# Patient Record
Sex: Female | Born: 1960 | Race: White | Hispanic: No | Marital: Married | Smoking: Never smoker
Health system: Southern US, Community
[De-identification: ages and names within clinical notes are randomized; demographics above are authoritative.]

## PROBLEM LIST (undated history)

## (undated) DIAGNOSIS — F419 Anxiety disorder, unspecified: Secondary | ICD-10-CM

## (undated) DIAGNOSIS — T7840XA Allergy, unspecified, initial encounter: Secondary | ICD-10-CM

## (undated) DIAGNOSIS — E559 Vitamin D deficiency, unspecified: Secondary | ICD-10-CM

## (undated) DIAGNOSIS — E7800 Pure hypercholesterolemia, unspecified: Secondary | ICD-10-CM

## (undated) DIAGNOSIS — D751 Secondary polycythemia: Secondary | ICD-10-CM

## (undated) DIAGNOSIS — Q8500 Neurofibromatosis, unspecified: Secondary | ICD-10-CM

## (undated) DIAGNOSIS — B019 Varicella without complication: Secondary | ICD-10-CM

## (undated) DIAGNOSIS — K219 Gastro-esophageal reflux disease without esophagitis: Secondary | ICD-10-CM

## (undated) DIAGNOSIS — N6019 Diffuse cystic mastopathy of unspecified breast: Secondary | ICD-10-CM

## (undated) HISTORY — DX: Varicella without complication: B01.9

## (undated) HISTORY — DX: Gastro-esophageal reflux disease without esophagitis: K21.9

## (undated) HISTORY — DX: Vitamin D deficiency, unspecified: E55.9

## (undated) HISTORY — DX: Pure hypercholesterolemia, unspecified: E78.00

## (undated) HISTORY — DX: Secondary polycythemia: D75.1

## (undated) HISTORY — DX: Anxiety disorder, unspecified: F41.9

## (undated) HISTORY — DX: Diffuse cystic mastopathy of unspecified breast: N60.19

## (undated) HISTORY — DX: Neurofibromatosis, unspecified: Q85.00

## (undated) HISTORY — DX: Allergy, unspecified, initial encounter: T78.40XA

---
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
## 1987-06-28 HISTORY — PX: OTHER SURGICAL HISTORY: SHX169

## 2008-03-06 ENCOUNTER — Ambulatory Visit: Payer: Self-pay

## 2008-03-20 ENCOUNTER — Ambulatory Visit: Payer: Self-pay

## 2009-03-30 ENCOUNTER — Ambulatory Visit: Payer: Self-pay

## 2009-04-09 ENCOUNTER — Ambulatory Visit: Payer: Self-pay

## 2011-08-30 ENCOUNTER — Ambulatory Visit: Payer: Self-pay

## 2013-11-29 LAB — HM PAP SMEAR: HM Pap smear: NORMAL

## 2014-09-19 LAB — CBC AND DIFFERENTIAL: WBC: 9 10^3/mL

## 2014-09-25 LAB — BASIC METABOLIC PANEL WITH GFR
BUN: 16 mg/dL (ref 4–21)
Creatinine: 0.8 mg/dL (ref 0.5–1.1)
Glucose: 80 mg/dL
Potassium: 4.2 mmol/L (ref 3.4–5.3)
Sodium: 138 mmol/L (ref 137–147)

## 2014-09-25 LAB — HEPATIC FUNCTION PANEL
ALT: 7 U/L (ref 7–35)
AST: 15 U/L (ref 13–35)
Alkaline Phosphatase: 80 U/L (ref 25–125)
Bilirubin, Total: 0.5 mg/dL

## 2014-09-25 LAB — LIPID PANEL
Cholesterol: 265 mg/dL — AB (ref 0–200)
HDL: 101 mg/dL — AB (ref 35–70)
LDL Cholesterol: 135 mg/dL
Triglycerides: 144 mg/dL (ref 40–160)

## 2014-09-25 LAB — TSH: TSH: 3.08 u[IU]/mL (ref 0.41–5.90)

## 2014-10-10 ENCOUNTER — Ambulatory Visit
Admit: 2014-10-10 | Disposition: A | Discharge status: Home / Self Care | Payer: Self-pay | Attending: Hematology and Oncology | Admitting: Hematology and Oncology

## 2014-10-10 DIAGNOSIS — D751 Secondary polycythemia: Secondary | ICD-10-CM | POA: Insufficient documentation

## 2014-10-10 LAB — COMPREHENSIVE METABOLIC PANEL
Albumin: 4.7 g/dL
Alkaline Phosphatase: 80 U/L
Anion Gap: 6 — ABNORMAL LOW (ref 7–16)
BUN: 18 mg/dL
Bilirubin,Total: 0.6 mg/dL
Calcium, Total: 10.3 mg/dL
Chloride: 102 mmol/L
Co2: 30 mmol/L
Creatinine: 0.73 mg/dL
EGFR (African American): >60
EGFR (Non-African Amer.): >60
Glucose: 95 mg/dL
Potassium: 4.3 mmol/L
SGOT(AST): 18 U/L
SGPT (ALT): 10 U/L — ABNORMAL LOW
Sodium: 138 mmol/L
Total Protein: 7.8 g/dL

## 2014-10-10 LAB — URINALYSIS, COMPLETE
Bacteria: NONE SEEN
Bilirubin,UR: NEGATIVE
Glucose,UR: NEGATIVE mg/dL (ref 0–75)
Ketone: NEGATIVE
Leukocyte Esterase: NEGATIVE
Nitrite: NEGATIVE
Ph: 7 (ref 4.5–8.0)
Protein: NEGATIVE
Specific Gravity: 1.005 (ref 1.003–1.030)

## 2014-10-10 LAB — CBC CANCER CENTER
Basophil #: 0 x10 3/mm (ref 0.0–0.1)
Basophil %: 0.5 %
Eosinophil #: 0.3 x10 3/mm (ref 0.0–0.7)
Eosinophil %: 2.8 %
HCT: 49.5 % — ABNORMAL HIGH (ref 35.0–47.0)
HGB: 16.5 g/dL — ABNORMAL HIGH (ref 12.0–16.0)
Lymphocyte #: 2.3 x10 3/mm (ref 1.0–3.6)
Lymphocyte %: 25.2 %
MCH: 31.3 pg (ref 26.0–34.0)
MCHC: 33.3 g/dL (ref 32.0–36.0)
MCV: 94 fL (ref 80–100)
Monocyte #: 0.8 x10 3/mm (ref 0.2–0.9)
Monocyte %: 9.5 %
Neutrophil #: 5.6 x10 3/mm (ref 1.4–6.5)
Neutrophil %: 62 %
Platelet: 208 x10 3/mm (ref 150–440)
RBC: 5.26 10*6/uL — ABNORMAL HIGH (ref 3.80–5.20)
RDW: 13.3 % (ref 11.5–14.5)
WBC: 9 x10 3/mm (ref 3.6–11.0)

## 2014-10-10 LAB — IRON AND TIBC
Iron Bind.Cap.(Total): 349 (ref 250–450)
Iron Saturation: 27.5
Iron: 96 ug/dL
Unbound Iron-Bind.Cap.: 252.7

## 2014-10-10 LAB — FERRITIN: Ferritin (ARMC): 66 ng/mL

## 2014-10-21 ENCOUNTER — Ambulatory Visit: Payer: Self-pay | Admitting: Nurse Practitioner

## 2014-10-21 ENCOUNTER — Telehealth: Payer: Self-pay

## 2014-10-21 NOTE — Telephone Encounter (Signed)
Please advise 

## 2014-10-21 NOTE — Telephone Encounter (Signed)
The patient called and is hoping to become a new patient of Dr.Tullo's.  She was informed Dr.Tullo is not taking on new patients at this time, but insisted a note be sent asking.

## 2014-10-21 NOTE — Telephone Encounter (Signed)
Since she did not offer a family member as an established patient i cannot extend my services to her unless I have done this personally, and I do not remember her or recognize her name.

## 2014-11-03 ENCOUNTER — Telehealth: Payer: Self-pay | Admitting: *Deleted

## 2014-11-03 LAB — HM MAMMOGRAPHY: HM MAMMO: NORMAL

## 2014-11-03 NOTE — Telephone Encounter (Signed)
Asking if the neurology referral has been made yet, she has a couple names. Dr Rudell Cobb or Dr Crisoforo Oxford at [facility] are both Neurologists

## 2014-11-04 ENCOUNTER — Encounter: Payer: Self-pay | Admitting: Hematology and Oncology

## 2014-11-04 ENCOUNTER — Telehealth: Payer: Self-pay | Admitting: *Deleted

## 2014-11-04 ENCOUNTER — Other Ambulatory Visit: Payer: Self-pay | Admitting: Hematology and Oncology

## 2014-11-04 DIAGNOSIS — Q8500 Neurofibromatosis, unspecified: Secondary | ICD-10-CM

## 2014-11-04 DIAGNOSIS — D751 Secondary polycythemia: Secondary | ICD-10-CM | POA: Insufficient documentation

## 2014-11-04 NOTE — Telephone Encounter (Signed)
No fu scheduled until June and is asking if we can call her with results of tests she has had done, sh eis also asking about the neurology referral appt

## 2014-11-05 ENCOUNTER — Telehealth: Payer: Self-pay | Admitting: *Deleted

## 2014-11-05 NOTE — Telephone Encounter (Signed)
B,  Sheryl Proctor was working on this yesterday.  I think she is going to talk with Juliann Pulse.  She was going to have a mammogram before follow-up.  M

## 2014-11-05 NOTE — Telephone Encounter (Signed)
pt has mammogram on 5/24 at [facility]. She has a f/u apt with Dr. Zenia Resides on 11/28/14. Pt wants to keep this appointment. Results reviewed with patient. She was reassured of findings.  Will discuss further with md at apt for f/u recommendations.  Reassured pt that neurology referral was entered.  Pt states that she saw Dr. Melrose Nakayama at [facility] neurology in April, but declines further visits with this provider. This is why she was requesting a new referral to a different neurologist.  Reassured patient that this order was entered yesterday. Scheduling will arrange.

## 2014-11-26 ENCOUNTER — Ambulatory Visit (INDEPENDENT_AMBULATORY_CARE_PROVIDER_SITE_OTHER): Payer: BLUE CROSS/BLUE SHIELD | Admitting: Internal Medicine

## 2014-11-26 ENCOUNTER — Encounter: Payer: Self-pay | Admitting: Internal Medicine

## 2014-11-26 ENCOUNTER — Encounter (INDEPENDENT_AMBULATORY_CARE_PROVIDER_SITE_OTHER): Payer: Self-pay

## 2014-11-26 VITALS — BP 118/80 | HR 74 | Temp 97.6°F | Resp 16 | Ht 60.0 in | Wt 111.2 lb

## 2014-11-26 DIAGNOSIS — F411 Generalized anxiety disorder: Secondary | ICD-10-CM | POA: Diagnosis not present

## 2014-11-26 DIAGNOSIS — R911 Solitary pulmonary nodule: Secondary | ICD-10-CM

## 2014-11-26 DIAGNOSIS — Z1211 Encounter for screening for malignant neoplasm of colon: Secondary | ICD-10-CM

## 2014-11-26 DIAGNOSIS — Z8669 Personal history of other diseases of the nervous system and sense organs: Secondary | ICD-10-CM | POA: Diagnosis not present

## 2014-11-26 MED ORDER — DIAZEPAM 5 MG PO TABS: 5.0000 mg | ORAL_TABLET | Freq: Two times a day (BID) | ORAL | Status: DC | PRN

## 2014-11-26 NOTE — Patient Instructions (Addendum)
Trial  Of low dose valium for your anxiety  You can cut it in half initially until you know how sedating it is  DO NOT SHARE WITH OTHERS OR COMBINE WITH ALCOHOL

## 2014-11-26 NOTE — Progress Notes (Signed)
Subjective:  Patient ID: Sheryl Proctor, female    DOB: 1960-07-24  Age: 54 y.o. MRN: [id number]  CC: The primary encounter diagnosis was Colon cancer screening. Diagnoses of H/O absence seizures, Anxiety state, and Solitary pulmonary nodule were also pertinent to this visit.  HPI Sheryl Proctor presents for  Establishment of care .    Has had 3 episodes of behavior described "spacing out'  During which she was observed by husband to have her eyes open but was unresponsive and made repetitive movements witht her lips.  The first known episode occurred in April .  She has also observed at work to have at least 2 similar episodes.   She has stopped working May 9 from [employer].  Stopped driving .     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  Family histor : father and uncles.  Had an MRI done last month which suggested recent seizure activity by presence of a T2 hyperintense left hippocampal complex . Referral was made to Dr Sheryl Proctor for evaluation with EEG.  Her initial OV with Neurology was done on April 18, and the EEG was done May 11 but she has not received any results as of June 1.  She was not impressed by her encounter and does not want to see him again.   He is requesting to be referred to one of 2 neurologists at [facility]  Instead:   Sheryl Proctor  And Dr. Vernard Proctor   History of polycythemia :  Newly diagnosed, Sees Sheryl Proctor for polycythemia ,workup still in progress   Chest mass:   R lung base April chest x ray.  Had CT of chest on 4/28, no results  Given to patient  bc she is seeing dr Sheryl Proctor on Friday   20 lb wt loss in the past year   [place] [facility] last PAP 2015 normal  Mammogram normal Nov 18 2014 History Sheryl Proctor has a past medical history of Neurofibromatosis; Polycythemia; Vitamin D deficiency; Allergy; Hypercholesteremia; GERD (gastroesophageal reflux disease); Fibrocystic breast; Anxiety; Neurofibromatosis; and Chicken pox.   She has past  surgical history that includes harrington rods (1975) and cryotherapy (1989).   Her family history includes Hypercholesterolemia in her mother; Hypertension in her mother; Lung cancer in her father; Thyroid disease in her sister; Uterine cancer in her maternal grandmother.She reports that she has never smoked. She has never used smokeless tobacco. She reports that she does not drink alcohol or use illicit drugs.  No outpatient prescriptions prior to visit.   No facility-administered medications prior to visit.    Review of Systems:  Patient denies headache, fevers, malaise, unintentional weight loss, skin rash, eye pain, sinus congestion and sinus pain, sore throat, dysphagia,  hemoptysis , cough, dyspnea, wheezing, chest pain, palpitations, orthopnea, edema, abdominal pain, nausea, melena, diarrhea, constipation, flank pain, dysuria, hematuria, urinary  Frequency, nocturia, numbness, tingling, seizures,  Focal weakness, Loss of consciousness,  Tremor, insomnia, depression, anxiety, and suicidal ideation.     Objective:  BP 118/80 mmHg  Pulse 74  Temp(Src) 97.6 F (36.4 C) (Oral)  Resp 16  Ht 5' (1.524 m)  Wt 111 lb 4 oz (50.463 kg)  BMI 21.73 kg/m2  SpO2 100%  Physical Exam:  General appearance: alert, cooperative and appears stated age Head: Normocephalic, without obvious abnormality, atraumatic Eyes: conjunctivae/corneas clear. PERRL, EOM's intact. Fundi benign. Ears: normal TM's and external ear canals both ears Nose: Nares normal. Septum midline. Mucosa normal.  No drainage or sinus tenderness. Throat: lips, mucosa, and tongue normal; teeth and gums normal Neck: no adenopathy, no carotid bruit, no JVD, supple, symmetrical, trachea midline and thyroid not enlarged, symmetric, no tenderness/mass/nodules Lungs: clear to auscultation bilaterally Breasts: normal appearance, no masses or tenderness Heart: regular rate and rhythm, S1, S2 normal, no murmur, click, rub or  gallop Abdomen: soft, non-tender; bowel sounds normal; no masses,  no organomegaly Extremities: extremities normal, atraumatic, no cyanosis or edema Pulses: 2+ and symmetric Skin: Skin color, texture, turgor normal. No rashes.  mulitiple discrete jodules Neurologic: Alert and oriented X 3, normal strength and tone. Normal symmetric reflexes. Normal coordination and gait.   Assessment & Plan:   Problem List Items Addressed This Visit    H/O absence seizures    Suggested by history,  MRI done showing hippocampal hyperintensity.  Agree with patient's decision not to drive until she has been treated.  She has had an EEG but Dr Sheryl Proctor has not notified her of the results and she is requesting a change in neurologist,  Referral to [facility] Neurology requested and in process.       Relevant Orders   Ambulatory referral to Neurology   Anxiety state    Low dose valium to use prn . The risks and benefits of benzodiazepine use were discussed with patient today including excessive sedation leading to respiratory depression,  impaired thinking/driving, and addiction.  Patient was advised to avoid concurrent use with alcohol, to use medication only as needed and not to share with others  .       Relevant Medications   diazepam (VALIUM) 5 MG tablet   Solitary pulmonary nodule    Right lung base, by chest x ray April 2016 workup in progress with T scan ordered by Dr Sheryl Proctor.         Other Visit Diagnoses    Colon cancer screening    -  Primary    Relevant Orders    Ambulatory referral to General Surgery       I am having Ms. Sheryl Proctor start on diazepam. I am also having her maintain her Vitamin D3 and vitamin E.  Meds ordered this encounter  Medications  . Cholecalciferol (VITAMIN D3) 2000 UNITS TABS    Sig: Take 1 tablet by mouth daily.  . vitamin E 400 UNIT capsule    Sig: Take 400 Units by mouth daily.  . diazepam (VALIUM) 5 MG tablet    Sig: Take 1 tablet (5 mg total) by mouth every 12  (twelve) hours as needed for anxiety.    Dispense:  30 tablet    Refill:  1    There are no discontinued medications.  Follow-up: Return in about 6 weeks (around 01/07/2015).   Crecencio Mc, MD

## 2014-11-26 NOTE — Progress Notes (Signed)
Pre-visit discussion using our clinic review tool. No additional management support is needed unless otherwise documented below in the visit note.  

## 2014-11-28 ENCOUNTER — Ambulatory Visit: Payer: Self-pay | Admitting: Hematology and Oncology

## 2014-11-28 ENCOUNTER — Inpatient Hospital Stay: Payer: BLUE CROSS/BLUE SHIELD

## 2014-11-28 ENCOUNTER — Inpatient Hospital Stay: Payer: BLUE CROSS/BLUE SHIELD | Attending: Hematology and Oncology | Admitting: Hematology and Oncology

## 2014-11-28 ENCOUNTER — Encounter (INDEPENDENT_AMBULATORY_CARE_PROVIDER_SITE_OTHER): Payer: Self-pay

## 2014-11-28 VITALS — BP 125/86 | HR 86 | Temp 96.1°F | Wt 112.4 lb

## 2014-11-28 DIAGNOSIS — D751 Secondary polycythemia: Secondary | ICD-10-CM

## 2014-11-28 DIAGNOSIS — E559 Vitamin D deficiency, unspecified: Secondary | ICD-10-CM | POA: Diagnosis not present

## 2014-11-28 DIAGNOSIS — Z79899 Other long term (current) drug therapy: Secondary | ICD-10-CM | POA: Diagnosis not present

## 2014-11-28 DIAGNOSIS — K219 Gastro-esophageal reflux disease without esophagitis: Secondary | ICD-10-CM | POA: Insufficient documentation

## 2014-11-28 DIAGNOSIS — R569 Unspecified convulsions: Secondary | ICD-10-CM | POA: Diagnosis not present

## 2014-11-28 DIAGNOSIS — R4182 Altered mental status, unspecified: Secondary | ICD-10-CM | POA: Diagnosis not present

## 2014-11-28 DIAGNOSIS — Q8500 Neurofibromatosis, unspecified: Secondary | ICD-10-CM | POA: Diagnosis not present

## 2014-11-28 DIAGNOSIS — N289 Disorder of kidney and ureter, unspecified: Secondary | ICD-10-CM

## 2014-11-28 LAB — CBC WITH DIFFERENTIAL/PLATELET
Basophils Absolute: 0.1 10*3/uL (ref 0–0.1)
Basophils Relative: 1 %
Eosinophils Absolute: 0.3 10*3/uL (ref 0–0.7)
Eosinophils Relative: 3 %
HCT: 47.7 % — ABNORMAL HIGH (ref 35.0–47.0)
Hemoglobin: 15.7 g/dL (ref 12.0–16.0)
Lymphocytes Relative: 21 %
Lymphs Abs: 2.3 10*3/uL (ref 1.0–3.6)
MCH: 31.3 pg (ref 26.0–34.0)
MCHC: 33 g/dL (ref 32.0–36.0)
MCV: 95.1 fL (ref 80.0–100.0)
Monocytes Absolute: 1.1 10*3/uL — ABNORMAL HIGH (ref 0.2–0.9)
Monocytes Relative: 11 %
Neutro Abs: 7.2 10*3/uL — ABNORMAL HIGH (ref 1.4–6.5)
Neutrophils Relative %: 66 %
Platelets: 207 10*3/uL (ref 150–440)
RBC: 5.01 MIL/uL (ref 3.80–5.20)
RDW: 13.3 % (ref 11.5–14.5)
WBC: 10.9 10*3/uL (ref 3.6–11.0)

## 2014-11-30 DIAGNOSIS — F411 Generalized anxiety disorder: Secondary | ICD-10-CM | POA: Insufficient documentation

## 2014-11-30 DIAGNOSIS — Z8669 Personal history of other diseases of the nervous system and sense organs: Secondary | ICD-10-CM | POA: Insufficient documentation

## 2014-11-30 DIAGNOSIS — R911 Solitary pulmonary nodule: Secondary | ICD-10-CM | POA: Insufficient documentation

## 2014-11-30 NOTE — Assessment & Plan Note (Signed)
Low dose valium to use prn . The risks and benefits of benzodiazepine use were discussed with patient today including excessive sedation leading to respiratory depression,  impaired thinking/driving, and addiction.  Patient was advised to avoid concurrent use with alcohol, to use medication only as needed and not to share with others  .

## 2014-11-30 NOTE — Assessment & Plan Note (Addendum)
Suggested by history,  MRI done showing hippocampal hyperintensity.  Agree with patient's decision not to drive until she has been treated.  She has had an EEG but Dr Melrose Nakayama has not notified her of the results and she is requesting a change in neurologist,  Referral to [facility] Neurology requested and in process.

## 2014-11-30 NOTE — Assessment & Plan Note (Signed)
Right lung base, by chest x ray April 2016 workup in progress with T scan ordered by Dr Mike Gip.

## 2014-12-01 ENCOUNTER — Encounter: Payer: Self-pay | Admitting: General Surgery

## 2014-12-05 ENCOUNTER — Ambulatory Visit
Admission: RE | Admit: 2014-12-05 | Discharge: 2014-12-05 | Disposition: A | Discharge status: Home / Self Care | Payer: BLUE CROSS/BLUE SHIELD | Source: Ambulatory Visit | Attending: Hematology and Oncology | Admitting: Hematology and Oncology

## 2014-12-05 DIAGNOSIS — N289 Disorder of kidney and ureter, unspecified: Secondary | ICD-10-CM | POA: Diagnosis present

## 2014-12-05 MED ORDER — GADOBENATE DIMEGLUMINE 529 MG/ML IV SOLN
10.0000 mL | Freq: Once | INTRAVENOUS | Status: AC | PRN
  Administered 2014-12-05: 10 mL via INTRAVENOUS

## 2014-12-08 ENCOUNTER — Telehealth: Payer: Self-pay

## 2014-12-08 ENCOUNTER — Inpatient Hospital Stay (HOSPITAL_BASED_OUTPATIENT_CLINIC_OR_DEPARTMENT_OTHER): Payer: BLUE CROSS/BLUE SHIELD | Admitting: Hematology and Oncology

## 2014-12-08 VITALS — BP 155/84 | HR 91 | Temp 96.7°F | Ht 60.0 in | Wt 112.2 lb

## 2014-12-08 DIAGNOSIS — Q8500 Neurofibromatosis, unspecified: Secondary | ICD-10-CM

## 2014-12-08 DIAGNOSIS — R569 Unspecified convulsions: Secondary | ICD-10-CM

## 2014-12-08 DIAGNOSIS — R4182 Altered mental status, unspecified: Secondary | ICD-10-CM | POA: Diagnosis not present

## 2014-12-08 DIAGNOSIS — N289 Disorder of kidney and ureter, unspecified: Secondary | ICD-10-CM

## 2014-12-08 DIAGNOSIS — D751 Secondary polycythemia: Secondary | ICD-10-CM | POA: Diagnosis not present

## 2014-12-08 DIAGNOSIS — Z79899 Other long term (current) drug therapy: Secondary | ICD-10-CM

## 2014-12-08 NOTE — Telephone Encounter (Signed)
Called and spoke with [facility] in Dr. Lupita Dawn office [phone] regarding referral to [facility] Dr. Nicola Girt or Dr. Vernard Gambles; Per Lenna Sciara, notes were faxed to their office on June 6th and they were going to contact patient; referral coordinator at Dr. Lupita Dawn office to contact [facility] and see where we are in this process; pt aware

## 2014-12-08 NOTE — Progress Notes (Signed)
[facility] day:  12/08/2014  Chief Complaint: Sheryl Proctor is an 54 y.o. female with neurofibromatosis and erythrocytosis who is seen for review of interval MRI.  HPI: The patient was last seen in the medical oncology clinic on 11/28/2014.  At that time, chest, abdomen, and pelvic CT scan as well as mammograms were reviewed.  CT scan revealed tiny sub-centimeter low-attenuation lesions in the kidney bilaterally to small to characterize. There was suggestion of some enhancing soft tissue along the posterior aspect of the subcentimeter lesion of the left kidney.  Abdominal MRI was recommended.  Abdominal MRI on 12/05/2014 revealed a tiny 8 mm lesion in interpolar region of the left kidney (indeterminant).  This may represent a tiny proteinaceous cysts surrounded  by normal enhancing renal parenchyma. Given its small size and indeterminate imaging, repeat evaluation with MRI of the abdomen with and without gadolinium was recommended in 6 months. She has hepatic steatosis. 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  Labs on 11/27/2016 revealed a hematocrit of 47.7, hemoglobin 13.7, platelets 207,000, and white count 10,900. Hematocrit had improved.  She did not require a phlebotomy.  Symptomatically, she is bored.  She denies any chest pain or shortness of breath.   Past Medical History  Diagnosis Date  . Neurofibromatosis   . Polycythemia   . Vitamin D deficiency   . Allergy   . Hypercholesteremia   . GERD (gastroesophageal reflux disease)   . Fibrocystic breast   . Anxiety   . Neurofibromatosis   . Chicken pox     Past Surgical History  Procedure Laterality Date  . Harrington rods  1975  . Cryotherapy  1989    Cervix    Family History  Problem Relation Age of Onset  . Hypertension Mother   . Hypercholesterolemia Mother   . Lung cancer Father   . Thyroid disease Sister   . Uterine  cancer Maternal Grandmother     Social History:  reports that she has never smoked. She has never used smokeless tobacco. She reports that she does not drink alcohol or use illicit drugs.    Allergies: No Known Allergies  Current Medications: Current Outpatient Prescriptions  Medication Sig Dispense Refill  . Cholecalciferol (VITAMIN D3) 2000 UNITS TABS Take 1 tablet by mouth daily.    . diazepam (VALIUM) 5 MG tablet Take 1 tablet (5 mg total) by mouth every 12 (twelve) hours as needed for anxiety. 30 tablet 1  . vitamin E 400 UNIT capsule Take 400 Units by mouth daily.     No current facility-administered medications for this visit.    Review of Systems:  GENERAL: Bored. No fevers, sweats. Weight stable. PERFORMANCE STATUS (ECOG): 1 HEENT: No visual changes, runny nose, sore throat, mouth sores or tenderness. Lungs: No shortness of breath or cough. No hemoptysis. Cardiac: No chest pain, palpitations, orthopnea, or PND. GI: No nausea, vomiting, diarrhea, constipation, melena or hematochezia. GU: No urgency, frequency, dysuria, or hematuria. Musculoskeletal: No back pain. No joint pain. No muscle tenderness. Extremities: No pain or swelling. Skin: No rashes or skin changes. Neuro: No seizure activity. No headache, numbness or weakness, balance or coordination issues. EEG on 11/01/2014. Endocrine: No diabetes, thyroid issues, hot flashes or night sweats. Psych: No mood changes, depression or anxiety. Pain: No focal pain. Review of systems: All other systems reviewed and found to be negative.  Physical Exam: Blood pressure 155/84, pulse 91, temperature 96.7  F (35.9 C), temperature source Tympanic, height 5' (1.524 m), weight 112 lb 3.4 oz (50.9 kg). GENERAL: Well developed, well nourished, sitting comfortably in the exam room in no acute distress. MENTAL STATUS: Alert and oriented to person, place and time. HEAD: Long curly hair. Normocephalic,  atraumatic, face symmetric, no Cushingoid features. EYES: Glasses. No conjunctivitis or scleral icterus. NEUROLOGICAL: Unremarkable. PSYCH: Appropriate.  No visits with results within 3 Day(s) from this visit. Latest known visit with results is:  Abstract on 12/01/2014  Component Date Value Ref Range Status  . WBC 09/19/2014 9.0   Final  . Glucose 09/25/2014 80   Final  . BUN 09/25/2014 16  4 - 21 mg/dL Final  . Creatinine 09/25/2014 0.8  0.5 - 1.1 mg/dL Final  . Potassium 09/25/2014 4.2  3.4 - 5.3 mmol/L Final  . Sodium 09/25/2014 138  137 - 147 mmol/L Final  . Triglycerides 09/25/2014 144  40 - 160 mg/dL Final  . Cholesterol 09/25/2014 265* 0 - 200 mg/dL Final  . HDL 09/25/2014 101* 35 - 70 mg/dL Final  . LDL Cholesterol 09/25/2014 135   Final  . Alkaline Phosphatase 09/25/2014 80  25 - 125 U/L Final  . ALT 09/25/2014 7  7 - 35 U/L Final  . AST 09/25/2014 15  13 - 35 U/L Final  . Bilirubin, Total 09/25/2014 0.5   Final  . TSH 09/25/2014 3.08  0.41 - 5.90 uIU/mL Final    Assessment:  Aubreana B Fanguy is an 54 y.o. female with neurofibromatosis and a recent history of polycythemia (hematocrit 50.4). She has denies any cardiac history, tobacco use, sleep apnea, exogenous testosterone or any medications. She has lost 21 pounds in the past year despite a very good diet.  Over the past 8 months, she has had several episodes of altered mental status/seizure activity. After these events, she is tired. She denies any tonic clonic activity or loss of bowel or bladder function. She notes poor sleep (2 hours at a time). She has hand tremors that "come and go".   Work-up on 10/10/2014 revealed a hematocrit of 49.5, hemoglobin 16.5, normal epo level (7.5), carboxyhemoglobin (1.2%), and ferritin (66). JAK2, CALR mutation and MPL mutation, were negative. Urinalysis revealed 1+ blood. She no longer has menses. CXR on 10/10/2014 revealed a rounded masslike density in the right lung base  medially (? related to scoliosis).  Hematocrit was 47.7 on 11/28/2014 (improved).  Head MRI on 10/17/2014 revealed a midly enlarged and T2 hyperintense left hippocampal complex, possible sequelae of seizure activity. There was prominent lateral and third ventricle size.   Chest, abdomen, and pelvic CT scan from 10/23/2014 revealed tiny sub cm low-attenuation lesions in the kidneys bilaterally too small to definitively characterize. While these may simply represent tiny cysts, there was a suggestion of some enhancing soft tissue along the posterior aspect of the sub cm lesion in the interpolar region of the left kidney. .  Abdominal MRI on 12/05/2014 revealed a tiny 8 mm lesion in interpolar region of the left kidney (indeterminant).  This may represent a tiny proteinaceous cysts surrounded  by normal enhancing renal parenchyma. Given its small size and indeterminate imaging, repeat evaluation with MRI of the abdomen with and without gadolinium was recommended in 6 months. She has hepatic steatosis. 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  Mammogram on 11/18/2014 [place] was negative.  Symptomatically, denies any new symptoms.  Plan: 1. Review interval abdominal  MRI.  Discuss plan for follow-up MRI in 6 months. 2. Schedule abdominal MRI in 6 months- f/u kidney lesion. 3. CBC in 3 months.  Nurse to call patient with result. 4. RTC after MRI for MD assessment and CBC.   Lequita Asal, MD  12/08/2014, 10:52 AM

## 2014-12-08 NOTE — Progress Notes (Signed)
Pt here today for follow up and MRI results

## 2014-12-11 ENCOUNTER — Encounter: Payer: Self-pay | Admitting: General Surgery

## 2014-12-11 ENCOUNTER — Ambulatory Visit (INDEPENDENT_AMBULATORY_CARE_PROVIDER_SITE_OTHER): Payer: BLUE CROSS/BLUE SHIELD | Admitting: General Surgery

## 2014-12-11 VITALS — BP 118/72 | HR 68 | Resp 14 | Ht 60.0 in | Wt 112.0 lb

## 2014-12-11 DIAGNOSIS — Z1211 Encounter for screening for malignant neoplasm of colon: Secondary | ICD-10-CM

## 2014-12-11 DIAGNOSIS — G40909 Epilepsy, unspecified, not intractable, without status epilepticus: Secondary | ICD-10-CM

## 2014-12-11 NOTE — Progress Notes (Signed)
Patient ID: Sheryl Proctor, female   DOB: 1960-10-23, 54 y.o.   MRN: [id number]  Chief Complaint  Patient presents with  . Colonoscopy    HPI Sheryl Proctor is a 54 y.o. female.  Here today to discuss having a colonoscopy. Denies any gastrointestinal issues. Bowels move daily with no bleeding noted. No family history of colon cancer. She has had an evaluation for possible seizures and plans on going for further evaluations. Dr. Melrose Nakayama has been following. She has been having spells where she will "zone out" for about 3 months. They would occur about every 1-2 weeks lasting about 1 minute with no after effects.  The patient will not fall down, but by her mother's description will appear as if she is in a trance.  She has been seen Dr. Mike Gip for abnormal labs. She is here today with her mom, [name].  HPI  Past Medical History  Diagnosis Date  . Neurofibromatosis   . Polycythemia   . Vitamin D deficiency   . Allergy   . Hypercholesteremia   . GERD (gastroesophageal reflux disease)   . Fibrocystic breast   . Anxiety   . Neurofibromatosis   . Chicken pox     Past Surgical History  Procedure Laterality Date  . Harrington rods  1975    in [place]  . Cryotherapy  1989    Cervix    Family History  Problem Relation Age of Onset  . Hypertension Mother   . Hypercholesterolemia Mother   . Lung cancer Father   . Thyroid disease Sister   . Uterine cancer Maternal Grandmother     Social History History  Substance Use Topics  . Smoking status: Never Smoker   . Smokeless tobacco: Never Used  . Alcohol Use: No    No Known Allergies  Current Outpatient Prescriptions  Medication Sig Dispense Refill  . Cholecalciferol (VITAMIN D3) 2000 UNITS TABS Take 1 tablet by mouth daily.    . diazepam (VALIUM) 5 MG tablet Take 1 tablet (5 mg total) by mouth every 12 (twelve) hours as needed for anxiety. 30 tablet 1  . vitamin E 400 UNIT capsule Take 400 Units by mouth daily.     No current  facility-administered medications for this visit.    Review of Systems Review of Systems  Constitutional: Negative.   Respiratory: Negative.   Cardiovascular: Negative.   Gastrointestinal: Negative for vomiting, diarrhea, constipation and anal bleeding.    Blood pressure 118/72, pulse 68, resp. rate 14, height 5' (1.524 m), weight 112 lb (50.803 kg).  Physical Exam Physical Exam  Constitutional: She is oriented to person, place, and time. She appears well-developed and well-nourished.  Eyes: Conjunctivae are normal. No scleral icterus.  Neck: Neck supple.  Cardiovascular: Normal rate, regular rhythm and normal heart sounds.   Occasional premature beat.  Pulmonary/Chest: Effort normal and breath sounds normal.  Abdominal: Soft.  Lymphadenopathy:    She has no cervical adenopathy.  Neurological: She is alert and oriented to person, place, and time.  Skin: Skin is warm and dry.    Data Reviewed EEG report. MRI report. PCP notes. Laboratory studies.  Assessment    Candidate for screening colonoscopy.  In progress evaluation for likely absence seizure disorder.    Plan    I would recommend postponing colonoscopy until the seizure disorder evaluation is complete. This will make a shunt during the procedure safer.    The procedure was reviewed in some detail, but once she is cleared  for anesthesia, she will be asked to touch base with the staff for a "refresher course".  Colonoscopy with possible biopsy/polypectomy prn: Information regarding the procedure, including its potential risks and complications (including but not limited to perforation of the bowel, which may require emergency surgery to repair, and bleeding) was verbally given to the patient. Educational information regarding lower instestinal endoscopy was given to the patient. Written instructions for how to complete the bowel prep using Miralax were provided. The importance of drinking ample fluids to avoid  dehydration as a result of the prep emphasized.  This can be completed once she has completed her evaluation of possible seizures.  PCP:  Mattie Marlin 12/12/2014, 3:34 PM

## 2014-12-11 NOTE — Patient Instructions (Signed)
Colonoscopy  A colonoscopy is an exam to look at the entire large intestine (colon). This exam can help find problems such as tumors, polyps, inflammation, and areas of bleeding. The exam takes about 1 hour.   LET YOUR HEALTH CARE PROVIDER KNOW ABOUT:   · Any allergies you have.  · All medicines you are taking, including vitamins, herbs, eye drops, creams, and over-the-counter medicines.  · Previous problems you or members of your family have had with the use of anesthetics.  · Any blood disorders you have.  · Previous surgeries you have had.  · Medical conditions you have.  RISKS AND COMPLICATIONS   Generally, this is a safe procedure. However, as with any procedure, complications can occur. Possible complications include:  · Bleeding.  · Tearing or rupture of the colon wall.  · Reaction to medicines given during the exam.  · Infection (rare).  BEFORE THE PROCEDURE   · Ask your health care provider about changing or stopping your regular medicines.  · You may be prescribed an oral bowel prep. This involves drinking a large amount of medicated liquid, starting the day before your procedure. The liquid will cause you to have multiple loose stools until your stool is almost clear or light green. This cleans out your colon in preparation for the procedure.  · Do not eat or drink anything else once you have started the bowel prep, unless your health care provider tells you it is safe to do so.  · Arrange for someone to drive you home after the procedure.  PROCEDURE   · You will be given medicine to help you relax (sedative).  · You will lie on your side with your knees bent.  · A long, flexible tube with a light and camera on the end (colonoscope) will be inserted through the rectum and into the colon. The camera sends video back to a computer screen as it moves through the colon. The colonoscope also releases carbon dioxide gas to inflate the colon. This helps your health care provider see the area better.  · During  the exam, your health care provider may take a small tissue sample (biopsy) to be examined under a microscope if any abnormalities are found.  · The exam is finished when the entire colon has been viewed.  AFTER THE PROCEDURE   · Do not drive for 24 hours after the exam.  · You may have a small amount of blood in your stool.  · You may pass moderate amounts of gas and have mild abdominal cramping or bloating. This is caused by the gas used to inflate your colon during the exam.  · Ask when your test results will be ready and how you will get your results. Make sure you get your test results.  Document Released: 06/10/2000 Document Revised: 04/03/2013 Document Reviewed: 02/18/2013  Exit[employer]® Patient Information ©2015 [employer]. This information is not intended to replace advice given to you by your health care provider. Make sure you discuss any questions you have with your health care provider.

## 2014-12-12 DIAGNOSIS — Z1211 Encounter for screening for malignant neoplasm of colon: Secondary | ICD-10-CM | POA: Insufficient documentation

## 2015-01-07 ENCOUNTER — Ambulatory Visit: Payer: BLUE CROSS/BLUE SHIELD | Admitting: Internal Medicine

## 2015-02-10 ENCOUNTER — Telehealth: Payer: Self-pay | Admitting: *Deleted

## 2015-02-10 MED ORDER — DIAZEPAM 5 MG PO TABS: 5.0000 mg | ORAL_TABLET | Freq: Two times a day (BID) | ORAL | Status: DC | PRN

## 2015-02-10 NOTE — Telephone Encounter (Signed)
Patient has requested to have her Diazepam Rx filled until her next appt.on 02/27/15. -Thanks

## 2015-02-10 NOTE — Telephone Encounter (Signed)
Last OV and refill 6.1.16.  Please advise refill

## 2015-02-10 NOTE — Telephone Encounter (Signed)
Ok to refill,  printed rx  

## 2015-02-10 NOTE — Telephone Encounter (Signed)
rx faxed

## 2015-02-27 ENCOUNTER — Encounter: Payer: Self-pay | Admitting: Internal Medicine

## 2015-02-27 ENCOUNTER — Ambulatory Visit (INDEPENDENT_AMBULATORY_CARE_PROVIDER_SITE_OTHER): Payer: BLUE CROSS/BLUE SHIELD | Admitting: Internal Medicine

## 2015-02-27 VITALS — BP 118/76 | HR 77 | Temp 98.2°F | Ht 60.0 in | Wt 121.6 lb

## 2015-02-27 DIAGNOSIS — Z8669 Personal history of other diseases of the nervous system and sense organs: Secondary | ICD-10-CM

## 2015-02-27 DIAGNOSIS — F411 Generalized anxiety disorder: Secondary | ICD-10-CM

## 2015-02-27 DIAGNOSIS — R911 Solitary pulmonary nodule: Secondary | ICD-10-CM

## 2015-02-27 DIAGNOSIS — I700 Atherosclerosis of aorta: Secondary | ICD-10-CM

## 2015-02-27 DIAGNOSIS — R634 Abnormal weight loss: Secondary | ICD-10-CM

## 2015-02-27 DIAGNOSIS — N289 Disorder of kidney and ureter, unspecified: Secondary | ICD-10-CM | POA: Diagnosis not present

## 2015-02-27 NOTE — Progress Notes (Signed)
Pre visit review using our clinic review tool, if applicable. No additional management support is needed unless otherwise documented below in the visit note. 

## 2015-02-27 NOTE — Progress Notes (Signed)
Subjective:  Patient ID: Sheryl Proctor, female    DOB: 09-23-1960  Age: 54 y.o. MRN: [id number]  CC: The primary encounter diagnosis was Kidney lesion, native, left. Diagnoses of Anxiety state, H/O absence seizures, Solitary pulmonary nodule, Loss of weight, and Atherosclerosis of abdominal aorta were also pertinent to this visit.  HPI Sheryl Proctor presents for follow up on multiple issues that were underworkup at her initial visit.   complex partial seizure,. New onset:  She has had no witnessed seizures this summer since June 16th.  lamotrigine was  started by [facility]  July 14  .  Driving has been discouraged until she is seizure free for 6 months.  She had 2nd EEG  Which was normall on August 2 Oct 11th sees neurology again. Repeat imaging is planned with Brain MRI Sept 6th .  Unintentional weight loss:  Screening for occult CA was done with normal mammogram  May 2016,  colonoscopy  deferred until she is seizure free x  6 Months.  CT chest abdomen and pelvis showed no lymphadenopathy or masses except for a  tissue enhancing  mass in the interpolar region of the left kidney.soft    and serial  CTs f renal mass and pulmonary nodule are planned.  She has considered deferring the 6 month follow up MRI with and without gadolinium .  This was not encouraged.     Anxiety with insomnia:  She reports that she is sleeping better with prn use of diazepam.  She finds herself getting bored at home because she cannot work or drive.  She is having a difficult time having to rely on others.   Fatty liver mentioned on MRI,  But not in descriiption of liver and not on CT. L iver enzymes have been repeatedly normal and she eis not overweight. CT    Ct also reported atherosclerosis of abdominal vasculature,  Fasting lipid panel was reviewed and Framiingham risk calculator used to assess 10 year risk at 3.5%   Previous concern for polycythemia (hg 17.1) resolved with repeat hgb of 16. She is a lifelong  Nonsmoker.         Outpatient Prescriptions Prior to Visit  Medication Sig Dispense Refill  . Cholecalciferol (VITAMIN D3) 2000 UNITS TABS Take 1 tablet by mouth daily.    . diazepam (VALIUM) 5 MG tablet Take 1 tablet (5 mg total) by mouth every 12 (twelve) hours as needed for anxiety. 30 tablet 1  . vitamin E 400 UNIT capsule Take 400 Units by mouth daily.     No facility-administered medications prior to visit.    Review of Systems;  Patient denies headache, fevers, malaise, unintentional weight loss, skin rash, eye pain, sinus congestion and sinus pain, sore throat, dysphagia,  hemoptysis , cough, dyspnea, wheezing, chest pain, palpitations, orthopnea, edema, abdominal pain, nausea, melena, diarrhea, constipation, flank pain, dysuria, hematuria, urinary  Frequency, nocturia, numbness, tingling, seizures,  Focal weakness, Loss of consciousness,  Tremor, insomnia, depression, anxiety, and suicidal ideation.      Objective:  BP 118/76 mmHg  Pulse 77  Temp(Src) 98.2 F (36.8 C) (Oral)  Ht 5' (1.524 m)  Wt 121 lb 9.6 oz (55.157 kg)  BMI 23.75 kg/m2  SpO2 98%  BP Readings from Last 3 Encounters:  02/27/15 118/76  12/11/14 118/72  12/08/14 155/84    Wt Readings from Last 3 Encounters:  02/27/15 121 lb 9.6 oz (55.157 kg)  12/11/14 112 lb (50.803 kg)  12/08/14 112 lb 3.4  oz (50.9 kg)    General appearance: alert, cooperative and appears stated age Ears: normal TM's and external ear canals both ears Throat: lips, mucosa, and tongue normal; teeth and gums normal Neck: no adenopathy, no carotid bruit, supple, symmetrical, trachea midline and thyroid not enlarged, symmetric, no tenderness/mass/nodules Back: symmetric, no curvature. ROM normal. No CVA tenderness. Lungs: clear to auscultation bilaterally Heart: regular rate and rhythm, S1, S2 normal, no murmur, click, rub or gallop Abdomen: soft, non-tender; bowel sounds normal; no masses,  no organomegaly Pulses: 2+ and  symmetric Skin: Skin color, texture, turgor normal. No rashes or lesions Lymph nodes: Cervical, supraclavicular, and axillary nodes normal.  No results found for: HGBA1C  Lab Results  Component Value Date   CREATININE 0.73 10/10/2014   CREATININE 0.8 09/25/2014    Lab Results  Component Value Date   WBC 10.9 11/28/2014   HGB 15.7 11/28/2014   HCT 47.7* 11/28/2014   PLT 207 11/28/2014   GLUCOSE 95 10/10/2014   CHOL 265* 09/25/2014   TRIG 144 09/25/2014   HDL 101* 09/25/2014   LDLCALC 135 09/25/2014   ALT 10* 10/10/2014   AST 18 10/10/2014   NA 138 10/10/2014   K 4.3 10/10/2014   CL 102 10/10/2014   CREATININE 0.73 10/10/2014   BUN 18 10/10/2014   CO2 30 10/10/2014   TSH 3.08 09/25/2014    Mr Abdomen W Wo Contrast  12/05/2014   CLINICAL DATA:  54 year old female with enhancing lesion in the left kidney noted on recent CT examination. Followup evaluation for further characterization. Erythrocytosis. Hematuria.  EXAM: MRI ABDOMEN WITHOUT AND WITH CONTRAST  TECHNIQUE: Multiplanar multisequence MR imaging of the abdomen was performed both before and after the administration of intravenous contrast.  CONTRAST:  19mL MULTIHANCE GADOBENATE DIMEGLUMINE 529 MG/ML IV SOLN  COMPARISON:  CT of the chest, abdomen and pelvis 10/23/2014.  FINDINGS: Lower chest: Multiple large right-sided lateral meningoceles are again noted in the lower thoracic spine. Small hiatal hernia.  Hepatobiliary: No cystic or solid hepatic lesions. No intra or extrahepatic biliary ductal dilatation. Gallbladder is normal in appearance.  Pancreas: No pancreatic mass. No pancreatic ductal dilatation. No pancreatic or peripancreatic fluid or inflammatory changes.  Spleen: Unremarkable.  Adrenals/Urinary Tract: The lesion of concern in the lateral aspect of the interpolar region of the left kidney is 8 mm in size and is low T1 and low T2 signal intensity, with no central internal enhancement, but with a thick rim of peripheral  enhancement on post gadolinium images. There is also a 1 cm low T1 high T2 signal intensity lesion in the interpolar region of the right kidney which does not enhance, compatible with a simple cyst. No hydroureteronephrosis. Bilateral adrenal glands are normal in appearance.  Stomach/Bowel: Visualized portions are unremarkable.  Vascular/Lymphatic: No aneurysm identified in the visualized abdominal vasculature. No lymphadenopathy noted in the abdomen.  Other: No ascites noted in the visualized portions of the peritoneal cavity.  Musculoskeletal: Susceptibility artifact noted throughout the spine related to indwelling spinal hardware.  IMPRESSION: 1. Tiny 8 mm lesion in the interpolar region of the left kidney in is indeterminate. This may simply represent a tiny proteinaceous cyst surrounded by a normal enhancing renal parenchyma, however, given its small size and indeterminate imaging characteristics this is considered a Bosniak class 19F lesion, and repeat evaluation is recommended with MRI of the abdomen with and without IV gadolinium in 6 months to ensure the stability of this lesion. 2. Hepatic steatosis. 3. Additional incidental findings,  as above.   Electronically Signed   By: Vinnie Langton M.D.   On: 12/05/2014 15:55    Assessment & Plan:   Problem List Items Addressed This Visit      Unprioritized   H/O absence seizures    Now controlled with daily use of lamotrigine.       Anxiety state    Secondary to unresolved serious medical issues.  Improved with prn diazepam The risks and benefits of benzodiazepine use were reviewed with patient today including excessive sedation leading to respiratory depression,  impaired thinking/driving, and addiction.  Patient was advised to avoid concurrent use with alcohol, to use medication only as needed and not to share with others  .       Solitary pulmonary nodule    There was no nodule  seen on Chest CT done in April       Kidney lesion, native,  left - Primary    Advised to have MRI with and without gadolinum at the appropriate screening interval      Loss of weight    Resolved,  She has gained 9 lbs since June , and has improved appetite.   Lab Results  Component Value Date   TSH 3.08 09/25/2014         Atherosclerosis of abdominal aorta    By CT scan without symptoms of mesenteric ischemia.  Fasting lipids were interpreted using hte framingham risk calculator to assess 10 year risk which was < 5%         A total of 40 minutes of face to face time was spent with patient more than half of which was spent in counselling, reviewing piror imaging studies,  and coordination of care .  I am having Ms. Koehn maintain her Vitamin D3, vitamin E, diazepam, and lamoTRIgine.  Meds ordered this encounter  Medications  . lamoTRIgine (LAMICTAL) 25 MG tablet    Sig: 1tab daily for 1 wk -> 1 tab bid for 1 wk -> 2 tabs in AM and 1 in PM for 1 wk -> 2 tabs bid for 1 wk -> 3 tabs bid for 1 wk ->4 tabs bid    There are no discontinued medications.  Follow-up: No Follow-up on file.   Crecencio Mc, MD

## 2015-03-01 DIAGNOSIS — I700 Atherosclerosis of aorta: Secondary | ICD-10-CM | POA: Insufficient documentation

## 2015-03-01 DIAGNOSIS — R634 Abnormal weight loss: Secondary | ICD-10-CM | POA: Insufficient documentation

## 2015-03-01 NOTE — Assessment & Plan Note (Signed)
There was no nodule  seen on Chest CT done in April

## 2015-03-01 NOTE — Assessment & Plan Note (Signed)
By CT scan without symptoms of mesenteric ischemia.  Fasting lipids were interpreted using hte framingham risk calculator to assess 10 year risk which was < 5%

## 2015-03-01 NOTE — Assessment & Plan Note (Signed)
Resolved,  She has gained 9 lbs since June , and has improved appetite.   Lab Results  Component Value Date   TSH 3.08 09/25/2014

## 2015-03-01 NOTE — Assessment & Plan Note (Signed)
Advised to have MRI with and without gadolinum at the appropriate screening interval

## 2015-03-01 NOTE — Assessment & Plan Note (Signed)
Secondary to unresolved serious medical issues.  Improved with prn diazepam The risks and benefits of benzodiazepine use were reviewed with patient today including excessive sedation leading to respiratory depression,  impaired thinking/driving, and addiction.  Patient was advised to avoid concurrent use with alcohol, to use medication only as needed and not to share with others  .

## 2015-03-01 NOTE — Assessment & Plan Note (Signed)
Now controlled with daily use of lamotrigine.

## 2015-03-09 ENCOUNTER — Inpatient Hospital Stay: Payer: BLUE CROSS/BLUE SHIELD | Attending: Hematology and Oncology

## 2015-03-09 DIAGNOSIS — D751 Secondary polycythemia: Secondary | ICD-10-CM

## 2015-03-09 DIAGNOSIS — D45 Polycythemia vera: Secondary | ICD-10-CM | POA: Insufficient documentation

## 2015-03-09 LAB — CBC
HCT: 45.6 % (ref 35.0–47.0)
Hemoglobin: 15.3 g/dL (ref 12.0–16.0)
MCH: 30.9 pg (ref 26.0–34.0)
MCHC: 33.6 g/dL (ref 32.0–36.0)
MCV: 91.9 fL (ref 80.0–100.0)
Platelets: 207 10*3/uL (ref 150–440)
RBC: 4.96 MIL/uL (ref 3.80–5.20)
RDW: 13.1 % (ref 11.5–14.5)
WBC: 7.9 10*3/uL (ref 3.6–11.0)

## 2015-06-01 ENCOUNTER — Telehealth: Payer: Self-pay | Admitting: *Deleted

## 2015-06-01 ENCOUNTER — Other Ambulatory Visit: Payer: Self-pay

## 2015-06-01 MED ORDER — DIAZEPAM 5 MG PO TABS: 5.0000 mg | ORAL_TABLET | Freq: Two times a day (BID) | ORAL | Status: DC | PRN

## 2015-06-01 NOTE — Telephone Encounter (Signed)
I have completed her request, needs Provider signature.

## 2015-06-01 NOTE — Telephone Encounter (Signed)
Patient has requested a medication refill for Diazepam.

## 2015-06-16 ENCOUNTER — Ambulatory Visit: Payer: BLUE CROSS/BLUE SHIELD

## 2015-06-18 ENCOUNTER — Ambulatory Visit: Payer: BLUE CROSS/BLUE SHIELD | Admitting: Hematology and Oncology

## 2015-06-18 ENCOUNTER — Other Ambulatory Visit: Payer: BLUE CROSS/BLUE SHIELD

## 2015-08-05 ENCOUNTER — Encounter: Payer: Self-pay | Admitting: Hematology and Oncology

## 2015-08-05 NOTE — Progress Notes (Signed)
[facility] day:  11/28/2014  Chief Complaint: Sheryl Proctor is a 55 y.o. 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  HPI: The patient was last seen in the medical oncology clinic on 10/20/2014.  At that time, she continued to lose weight. She has had no interval seizure activity.  We  discussed her initial labs.. Chest x-ray  had revealed a rounded masslike density in the right lung base medially (question related to scoliosis).   Head MRI revealed a mildly enlarged and T2 hyperintense left hippocampal complex possibly related to sequela of seizure activity. There were prominent lateral and third ventricles.  Given the concern for ongoing weight loss and possible malignancy in neurofibromatosis, she was scheduled for further studies. A mammogram as well as chest, abdomen, and pelvic CT scan were scheduled. She also requested follow-up with a neurologist specializing in neurofibromatosis.  Chest, abdomen, and pelvic CT scan from 10/23/2014 revealed tiny sub cm low-attenuation lesions in the kidneys bilaterally too small to definitively characterize.  While these may simply represent tiny cysts, there was a suggestion of some enhancing soft tissue along the posterior aspect of the sub cm lesion in the interpolar region of the left kidney. Further characterization with MRI with and without IV gadolinium was suggested to provide additional diagnostic information if there is clinical concern for renal malignancy.  Mammogram on 11/18/2014 [place] was negative.  Symptomatically, she feels "alright", bu notes fatigue.  She notes no more events.  She notes an EEG on 11/01/2014 at [facility] She saw Dr. Vanita Ingles.  Past Medical History  Diagnosis Date  . Neurofibromatosis   . Polycythemia   . Vitamin D deficiency   . Allergy   . Hypercholesteremia   . GERD (gastroesophageal reflux  disease)   . Fibrocystic breast   . Anxiety   . Neurofibromatosis   . Chicken pox     Past Surgical History  Procedure Laterality Date  . Harrington rods  1975    in [place]  . Cryotherapy  1989    Cervix    Family History  Problem Relation Age of Onset  . Hypertension Mother   . Hypercholesterolemia Mother   . Lung cancer Father   . Thyroid disease Sister   . Uterine cancer Maternal Grandmother     Social History:  reports that she has never smoked. She has never used smokeless tobacco. She reports that she does not drink alcohol or use illicit drugs.  The patient is accompanied by a woman today.  Allergies: No Known Allergies  Current Medications: Current Outpatient Prescriptions  Medication Sig Dispense Refill  . Cholecalciferol (VITAMIN D3) 2000 UNITS TABS Take 1 tablet by mouth daily.    . vitamin E 400 UNIT capsule Take 400 Units by mouth daily.    . diazepam (VALIUM) 5 MG tablet Take 1 tablet (5 mg total) by mouth every 12 (twelve) hours as needed for anxiety. 30 tablet 1  . lamoTRIgine (LAMICTAL) 25 MG tablet 1tab daily for 1 wk -> 1 tab bid for 1 wk -> 2 tabs in AM and 1 in PM for 1 wk -> 2 tabs bid for 1 wk -> 3 tabs bid for 1 wk ->4 tabs bid     No current facility-administered medications for this visit.    Review of Systems:  GENERAL:  Feels alright.  No fevers, sweats.  Weight up 3.7 pounds since last  visit. PERFORMANCE STATUS (ECOG):  1 HEENT:  No visual changes, runny nose, sore throat, mouth sores or tenderness. Lungs: No shortness of breath or cough.  No hemoptysis. Cardiac:  No chest pain, palpitations, orthopnea, or PND. GI:  No nausea, vomiting, diarrhea, constipation, melena or hematochezia. GU:  No urgency, frequency, dysuria, or hematuria. Musculoskeletal:  No back pain.  No joint pain.  No muscle tenderness. Extremities:  No pain or swelling. Skin:  No rashes or skin changes. Neuro:  No interval seizure activity.  No headache, numbness or  weakness, balance or coordination issues.  EEG on 11/01/2014. Endocrine:  No diabetes, thyroid issues, hot flashes or night sweats. Psych:  No mood changes, depression or anxiety. Pain:  No focal pain. Review of systems:  All other systems reviewed and found to be negative.  Physical Exam: Blood pressure 125/86, pulse 86, temperature 96.1 F (35.6 C), temperature source Tympanic, weight 112 lb 7 oz (51 kg). GENERAL:  Well developed, well nourished, sitting comfortably in the exam room in no acute distress. MENTAL STATUS:  Alert and oriented to person, place and time. HEAD:  Long curly hair.  Normocephalic, atraumatic, face symmetric, no Cushingoid features. EYES:  Glasses.  Pupils equal round and reactive to light and accomodation.  No conjunctivitis or scleral icterus. ENT:  Oropharynx clear without lesion.  Tongue normal. Mucous membranes moist.  RESPIRATORY:  Clear to auscultation without rales, wheezes or rhonchi. CARDIOVASCULAR:  Regular rate and rhythm without murmur, rub or gallop. ABDOMEN:  Soft, non-tender, with active bowel sounds, and no hepatosplenomegaly.  No masses. SKIN:  No rashes, ulcers or lesions. EXTREMITIES: No edema, no skin discoloration or tenderness.  No palpable cords. LYMPH NODES: No palpable cervical, supraclavicular, axillary or inguinal adenopathy  NEUROLOGICAL: Unremarkable. PSYCH:  Appropriate.  Appointment on 11/28/2014  Component Date Value Ref Range Status  . WBC 11/28/2014 10.9  3.6 - 11.0 K/uL Final  . RBC 11/28/2014 5.01  3.80 - 5.20 MIL/uL Final  . Hemoglobin 11/28/2014 15.7  12.0 - 16.0 g/dL Final  . HCT 11/28/2014 47.7* 35.0 - 47.0 % Final  . MCV 11/28/2014 95.1  80.0 - 100.0 fL Final  . MCH 11/28/2014 31.3  26.0 - 34.0 pg Final  . MCHC 11/28/2014 33.0  32.0 - 36.0 g/dL Final  . RDW 11/28/2014 13.3  11.5 - 14.5 % Final  . Platelets 11/28/2014 207  150 - 440 K/uL Final  . Neutrophils Relative % 11/28/2014 66%   Final  . Neutro Abs 11/28/2014  7.2* 1.4 - 6.5 K/uL Final  . Lymphocytes Relative 11/28/2014 21%   Final  . Lymphs Abs 11/28/2014 2.3  1.0 - 3.6 K/uL Final  . Monocytes Relative 11/28/2014 11%   Final  . Monocytes Absolute 11/28/2014 1.1* 0.2 - 0.9 K/uL Final  . Eosinophils Relative 11/28/2014 3%   Final  . Eosinophils Absolute 11/28/2014 0.3  0 - 0.7 K/uL Final  . Basophils Relative 11/28/2014 1%   Final  . Basophils Absolute 11/28/2014 0.1  0 - 0.1 K/uL Final  Office Visit on 11/26/2014  Component Date Value Ref Range Status  . HM Mammogram 11/03/2014 normal   Final  . HM Pap smear 11/29/2013 normal [place]   Final    Assessment:  Jamaya B Courtney is a 55 y.o. female with neurofibromatosis and a recent history of polycythemia (hematocrit 50.4).  She has denies any cardiac history, tobacco use, sleep apnea, exogenous testosterone or any medications.  She has lost 21 pounds in the past  year despite a very good diet.  Over the past 8 months, she has had several episodes of altered mental status/seizure activity.  After these events, she is tired.  She denies any tonic clonic activity or loss of bowel or bladder function.  She notes poor sleep (2 hours at a time).  She has hand tremors that "come and go".   Work-up on 10/10/2014 revealed a hematocrit of 49.5, hemoglobin 16.5, normal epo level (7.5), carboxyhemoglobin (1.2%), and ferritin (66).  JAK2, CALR mutation and MPL mutation, were negative.  Urinalysis revealed 1+ blood.  She no longer has menses.  CXR on 10/10/2014 revealed a rounded masslike density in the right lung base medially (? related to scoliosis).  Head MRI on 10/17/2014 revealed a midly enlarged and T2 hyperintense left hippocampal complex, possible sequelae of seizure activity.  There was prominent lateral and third ventricle size.   Chest, abdomen, and pelvic CT scan from 10/23/2014 revealed tiny sub cm low-attenuation lesions in the kidneys bilaterally too small to definitively characterize.  While these may  simply represent tiny cysts, there was a suggestion of some enhancing soft tissue along the posterior aspect of the sub cm lesion in the interpolar region of the left kidney. .  Mammogram on 11/18/2014 [place] was negative.  Symptomatically, she is fatigued.  She has had no recent seizure activity.  Hematocrit has improved (47.7).  Plan: 1. Review chest, abdomen, and pelvic CT scan.  Review mammogram.  Discuss plan for MRI abdomen to assess renal lesions. 2. Discuss erythrocytosis.  Hematocrit is drifting down.  By criteria, erythrocytosis if hematocrit > 48.  Consider phlebotomy in future if hematocrit elevated and symptomatic. 3. Labs today:  CBC with diff. 4. Schedule abdominal MRI. 5. Obtain copy of EEG. 6. RTC after MRI.   Lequita Asal, MD  11/28/2014

## 2015-10-02 ENCOUNTER — Other Ambulatory Visit: Payer: Self-pay | Admitting: *Deleted

## 2015-10-02 MED ORDER — DIAZEPAM 5 MG PO TABS: 5.0000 mg | ORAL_TABLET | Freq: Two times a day (BID) | ORAL | Status: AC | PRN

## 2015-10-02 NOTE — Telephone Encounter (Signed)
Patient requested a medication refill for [address]

## 2015-10-02 NOTE — Telephone Encounter (Signed)
Last OV was 02/27/15, last refill was 06/01/2015 for this medication.  Please advise refill. Thanks

## 2015-10-02 NOTE — Telephone Encounter (Signed)
Ok to Refill for 30 days only.  OFFICE VISIT NEEDED prior to any more refills

## 2016-05-27 IMAGING — MR MR ABDOMEN WO/W CM
17 of 18 series · 43 of 48 positions shown · IV contrast (multihance)
Comparison: CT of the chest, abdomen and pelvis 10/23/2014.

CLINICAL DATA: 53-year-old female with enhancing lesion in the left
kidney noted on recent CT examination. Followup evaluation for
further characterization. Erythrocytosis. Hematuria.

EXAM:
MRI ABDOMEN WITHOUT AND WITH CONTRAST
TECHNIQUE: Multiplanar multisequence MR imaging of the abdomen was performed
both before and after the administration of intravenous contrast.
CONTRAST:  10mL MULTIHANCE GADOBENATE DIMEGLUMINE 529 MG/ML IV SOLN

[Series 3: cor ssfse / · coronal · 7.0mm · 1.48mm/px · 2 of 30 slices shown]
[im 1/30]
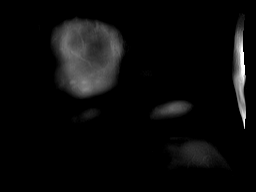
[im 30/30]
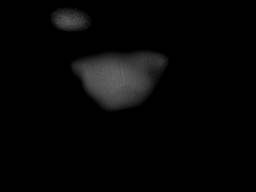

[Series 4: T2 · axial · 6.0mm · 1.48mm/px · z∈[-55,+154]mm · 2 of 30 slices shown]
[im 1/30]
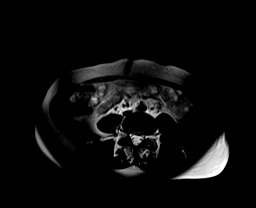
[im 30/30]
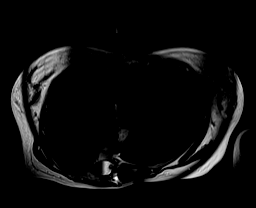

[Series 7: DWI · axial · 6.0mm · 2.00mm/px · z∈[-55,+154]mm · 5 of 88 slices shown (1 of 2)]
[im 1/88]
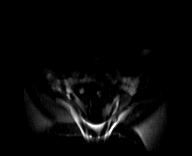
[im 22/88]
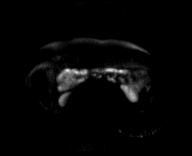
[im 44/88]
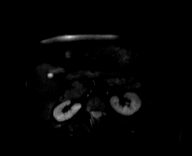
[im 66/88]
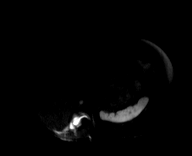
[im 88/88]
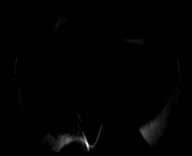

[Series 8: DWI · axial · 6.0mm · 2.00mm/px · z∈[-55,+154]mm · 2 of 30 slices shown (2 of 2)]
[im 1/30]
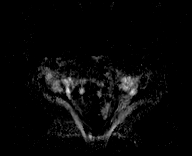
[im 30/30]
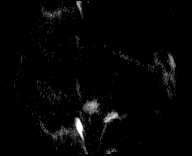

[Series 9: bSSFP · axial · 6.0mm · 0.74mm/px · z∈[-55,+154]mm · 2 of 30 slices shown]
[im 1/30]
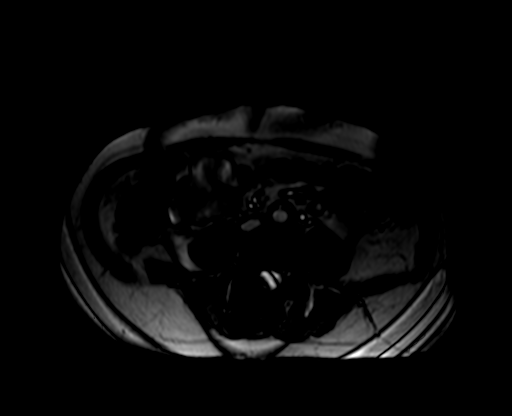
[im 30/30]
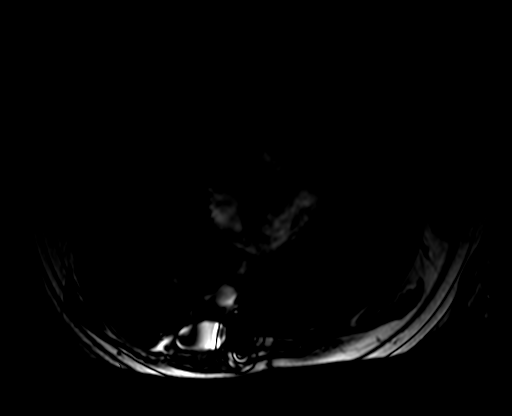

[Series 10: axial dynamic pre · axial · non-contrast · 4.0mm · 1.19mm/px · z∈[-77,+175]mm · 3 of 64 slices shown]
[im 1/64]
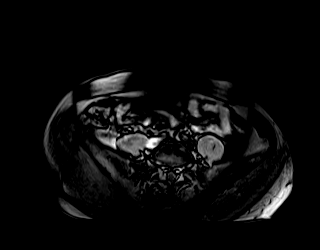
[im 32/64]
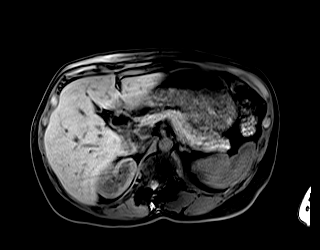
[im 64/64]
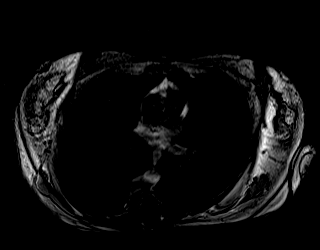

[Series 11: axial dynamic post · axial · 4.0mm · 1.19mm/px · z∈[-77,+175]mm · 3 of 64 slices shown (1 of 3)]
[im 1/64]
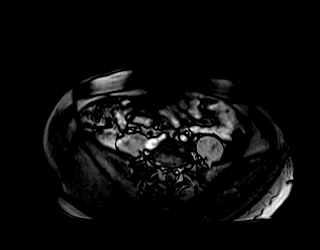
[im 32/64]
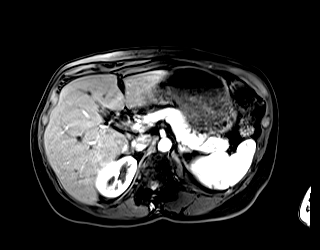
[im 64/64]
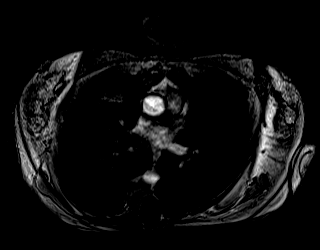

[Series 12: axial dynamic post · axial · 4.0mm · 1.19mm/px · z∈[-77,+175]mm · 3 of 64 slices shown (2 of 3)]
[im 1/64]
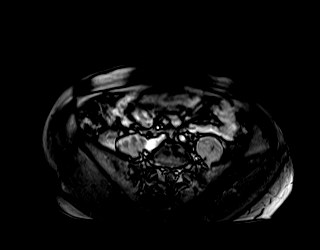
[im 32/64]
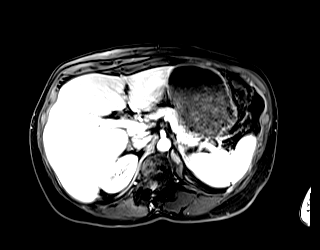
[im 64/64]
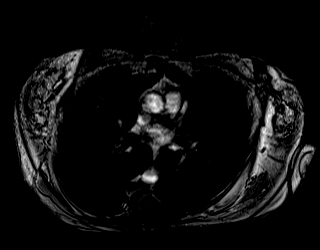

[Series 13: axial dynamic post · axial · 4.0mm · 1.19mm/px · z∈[-77,+175]mm · 3 of 64 slices shown (3 of 3)]
[im 1/64]
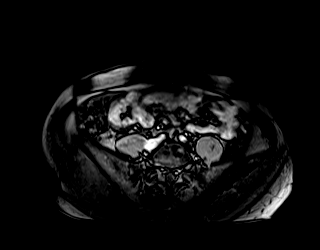
[im 32/64]
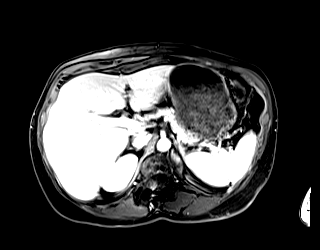
[im 64/64]
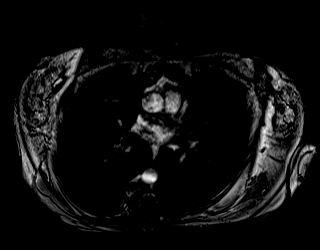

[Series 15: axial ssfse / · axial · 6.0mm · 1.19mm/px · 1 of 30 slices shown]
[im 1/30]
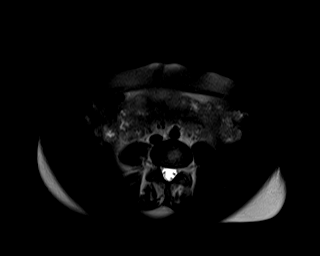

[Series 16: axial dynamic 3 · axial · 4.0mm · 1.19mm/px · z∈[-77,+175]mm · 3 of 64 slices shown]
[im 1/64]
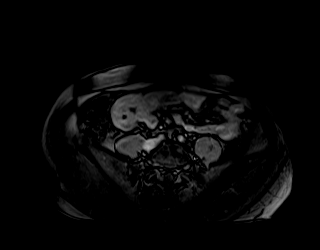
[im 32/64]
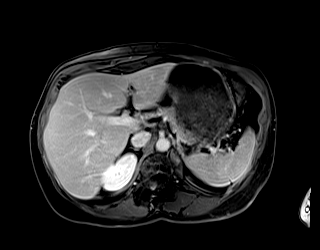
[im 64/64]
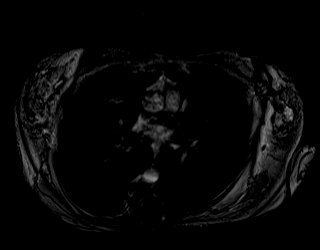

[Series 100: out of phase · axial · 6.0mm · 0.74mm/px · 1 of 30 slices shown]
[im 1/30]
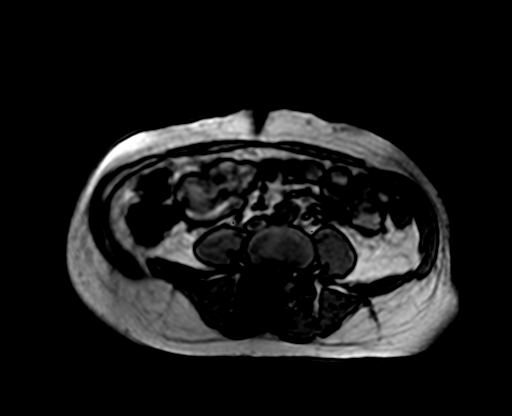

[Series 101: in phase axial · axial · 6.0mm · 0.74mm/px · 1 of 30 slices shown]
[im 1/30]
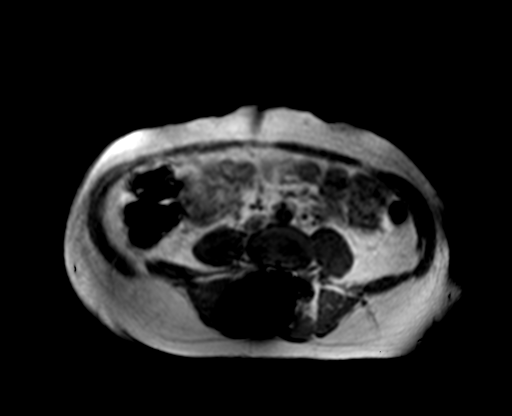

[Series 106: axial sub - · axial · 4.0mm · 1.19mm/px · z∈[-77,+175]mm · 3 of 64 slices shown (1 of 4)]
[im 1/64]
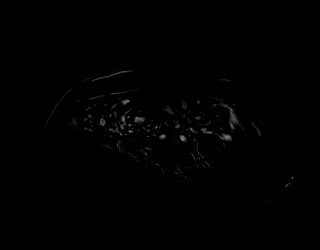
[im 32/64]
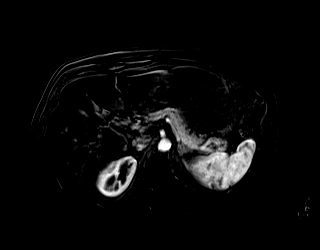
[im 64/64]
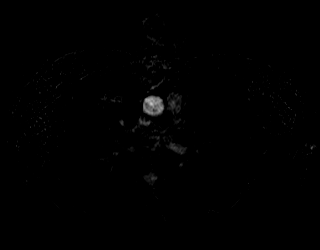

[Series 107: axial sub - · axial · 4.0mm · 1.19mm/px · z∈[-77,+175]mm · 3 of 64 slices shown (2 of 4)]
[im 1/64]
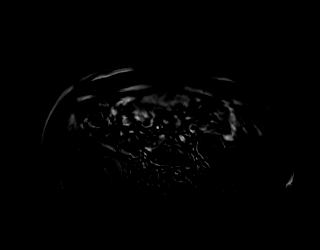
[im 32/64]
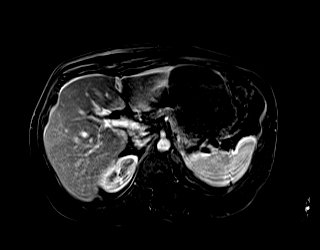
[im 64/64]
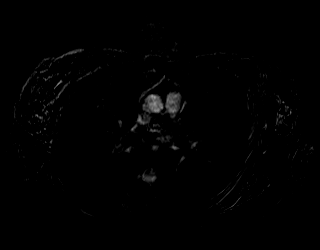

[Series 108: axial sub - · axial · 4.0mm · 1.19mm/px · z∈[-77,+175]mm · 3 of 64 slices shown (3 of 4)]
[im 1/64]
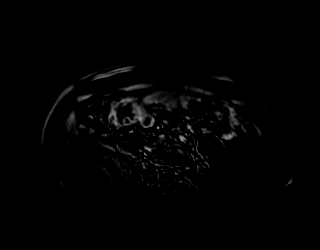
[im 32/64]
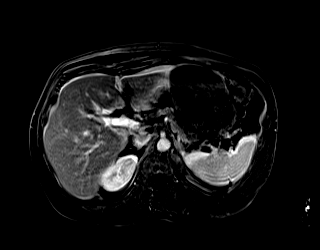
[im 64/64]
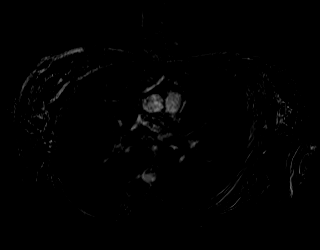

[Series 109: axial sub - · axial · 4.0mm · 1.19mm/px · z∈[-77,+175]mm · 3 of 64 slices shown (4 of 4)]
[im 1/64]
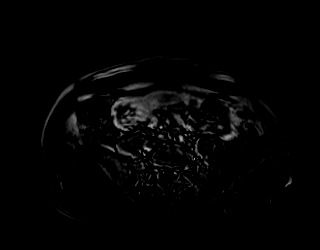
[im 32/64]
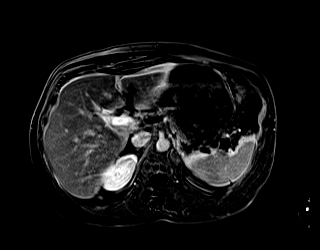
[im 64/64]
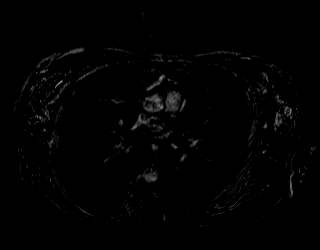

[43 of 48 positions shown; findings below may reference images not displayed]

FINDINGS: Lower chest: Multiple large right-sided lateral meningoceles are
again noted in the lower thoracic spine. Small hiatal hernia.

Hepatobiliary: No cystic or solid hepatic lesions. No intra or
extrahepatic biliary ductal dilatation. Gallbladder is normal in
appearance.

Pancreas: No pancreatic mass. No pancreatic ductal dilatation. No
pancreatic or peripancreatic fluid or inflammatory changes.

Spleen: Unremarkable.

Adrenals/Urinary Tract: The lesion of concern in the lateral aspect
of the interpolar region of the left kidney is 8 mm in size and is
low T1 and low T2 signal intensity, with no central internal
enhancement, but with a thick rim of peripheral enhancement on post
gadolinium images. There is also a 1 cm low T1 high T2 signal
intensity lesion in the interpolar region of the right kidney which
does not enhance, compatible with a simple cyst. No
hydroureteronephrosis. Bilateral adrenal glands are normal in
appearance.

Stomach/Bowel: Visualized portions are unremarkable.

Vascular/Lymphatic: No aneurysm identified in the visualized
abdominal vasculature. No lymphadenopathy noted in the abdomen.

Other: No ascites noted in the visualized portions of the peritoneal
cavity.

Musculoskeletal: Susceptibility artifact noted throughout the spine
related to indwelling spinal hardware.
IMPRESSION: 1. Tiny 8 mm lesion in the interpolar region of the left kidney in
is indeterminate. This may simply represent a tiny proteinaceous
cyst surrounded by a normal enhancing renal parenchyma, however,
given its small size and indeterminate imaging characteristics this
is considered a Bosniak class 2F lesion, and repeat evaluation is
recommended with MRI of the abdomen with and without IV gadolinium
in 6 months to ensure the stability of this lesion.
2. Hepatic steatosis.
3. Additional incidental findings, as above.

## 2016-11-17 ENCOUNTER — Ambulatory Visit: Payer: BLUE CROSS/BLUE SHIELD | Admitting: Obstetrics and Gynecology

## 2018-02-07 ENCOUNTER — Ambulatory Visit (INDEPENDENT_AMBULATORY_CARE_PROVIDER_SITE_OTHER): Payer: BLUE CROSS/BLUE SHIELD | Admitting: Obstetrics and Gynecology

## 2018-02-07 ENCOUNTER — Encounter: Payer: Self-pay | Admitting: Obstetrics and Gynecology

## 2018-02-07 ENCOUNTER — Other Ambulatory Visit (HOSPITAL_COMMUNITY)
Admission: RE | Admit: 2018-02-07 | Discharge: 2018-02-07 | Disposition: A | Discharge status: Home / Self Care | Payer: BLUE CROSS/BLUE SHIELD | Source: Ambulatory Visit | Attending: Obstetrics and Gynecology | Admitting: Obstetrics and Gynecology

## 2018-02-07 VITALS — BP 120/70 | HR 67 | Ht 60.0 in | Wt 110.0 lb

## 2018-02-07 DIAGNOSIS — Z124 Encounter for screening for malignant neoplasm of cervix: Secondary | ICD-10-CM | POA: Insufficient documentation

## 2018-02-07 DIAGNOSIS — D582 Other hemoglobinopathies: Secondary | ICD-10-CM

## 2018-02-07 DIAGNOSIS — Z1231 Encounter for screening mammogram for malignant neoplasm of breast: Secondary | ICD-10-CM | POA: Diagnosis not present

## 2018-02-07 DIAGNOSIS — Z1211 Encounter for screening for malignant neoplasm of colon: Secondary | ICD-10-CM | POA: Diagnosis not present

## 2018-02-07 DIAGNOSIS — Z1151 Encounter for screening for human papillomavirus (HPV): Secondary | ICD-10-CM | POA: Insufficient documentation

## 2018-02-07 DIAGNOSIS — Z13220 Encounter for screening for lipoid disorders: Secondary | ICD-10-CM

## 2018-02-07 DIAGNOSIS — Z1239 Encounter for other screening for malignant neoplasm of breast: Secondary | ICD-10-CM

## 2018-02-07 DIAGNOSIS — Z0000 Encounter for general adult medical examination without abnormal findings: Secondary | ICD-10-CM

## 2018-02-07 DIAGNOSIS — Z01411 Encounter for gynecological examination (general) (routine) with abnormal findings: Secondary | ICD-10-CM | POA: Diagnosis not present

## 2018-02-07 DIAGNOSIS — Z01419 Encounter for gynecological examination (general) (routine) without abnormal findings: Secondary | ICD-10-CM

## 2018-02-07 LAB — HEMOCCULT GUIAC POC 1CARD (OFFICE): FECAL OCCULT BLD: NEGATIVE

## 2018-02-07 NOTE — Patient Instructions (Signed)
I value your feedback and entrusting us with your care. If you get a [facility] patient survey, I would appreciate you taking the time to let us know about your experience today. Thank you! 

## 2018-02-07 NOTE — Progress Notes (Signed)
PCP: Crecencio Mc, MD   Chief Complaint  Patient presents with  . Gynecologic Exam    HPI:      Ms. Sheryl Proctor is a 57 y.o. G2P2 who LMP was No LMP recorded (lmp unknown). Patient is postmenopausal., presents today for her annual examination.  Her menses are absent due to menopause. She does not have intermenstrual bleeding. She does have vasomotor sx and has taken estroven twice. Not interested in HRT.   Sex activity: single partner, contraception - post menopausal status. She does not have vaginal dryness.  Last Pap: August 12, 2013  Results were: benign cellular changes /neg HPV DNA.  Hx of STDs: none  Last mammogram: not recent There is no FH of breast cancer. There is no FH of ovarian cancer. The patient does do self-breast exams.  Colonoscopy: never; pt wants to hold off for now due to med bills  Tobacco use: The patient denies current or previous tobacco use. Alcohol use: none Exercise: moderately active  She does get adequate calcium and Vitamin D in her diet.  No recent labs. Hx of borderline lipids in past and elevated HgB and Hct. Hx of seizures, followed by neuro.   Past Medical History:  Diagnosis Date  . Allergy   . Anxiety   . Chicken pox   . Fibrocystic breast   . GERD (gastroesophageal reflux disease)   . Hypercholesteremia   . Neurofibromatosis ([place])   . Neurofibromatosis ([place])   . Polycythemia   . Vitamin D deficiency     Past Surgical History:  Procedure Laterality Date  . cryotherapy  1989   Cervix  . harrington rods  1975   in [place]    Family History  Problem Relation Age of Onset  . Hypertension Mother   . Hypercholesterolemia Mother   . Lung cancer Father   . Thyroid disease Sister   . Uterine cancer Maternal Grandmother     Social History   Socioeconomic History  . Marital status: Married    Spouse name: Not on file  . Number of children: Not on file  . Years of education: Not on file  . Highest education level:  Not on file  Occupational History  . Not on file  Social Needs  . Financial resource strain: Not on file  . Food insecurity:    Worry: Not on file    Inability: Not on file  . Transportation needs:    Medical: Not on file    Non-medical: Not on file  Tobacco Use  . Smoking status: Never Smoker  . Smokeless tobacco: Never Used  Substance and Sexual Activity  . Alcohol use: No    Alcohol/week: 0.0 standard drinks  . Drug use: No  . Sexual activity: Yes    Birth control/protection: Post-menopausal  Lifestyle  . Physical activity:    Days per week: Not on file    Minutes per session: Not on file  . Stress: Not on file  Relationships  . Social connections:    Talks on phone: Not on file    Gets together: Not on file    Attends religious service: Not on file    Active member of club or organization: Not on file    Attends meetings of clubs or organizations: Not on file    Relationship status: Not on file  . Intimate partner violence:    Fear of current or ex partner: Not on file    Emotionally abused: Not on  file    Physically abused: Not on file    Forced sexual activity: Not on file  Other Topics Concern  . Not on file  Social History Narrative  . Not on file    Outpatient Medications Prior to Visit  Medication Sig Dispense Refill  . diazepam (VALIUM) 5 MG tablet Take 1 tablet (5 mg total) by mouth every 12 (twelve) hours as needed for anxiety. 30 tablet 0  . lamoTRIgine (LAMICTAL) 100 MG tablet Take by mouth.    . loratadine (CLARITIN) 10 MG tablet Take by mouth.    . Cholecalciferol (VITAMIN D3) 2000 UNITS TABS Take 1 tablet by mouth daily.    Marland Kitchen lamoTRIgine (LAMICTAL) 25 MG tablet 1tab daily for 1 wk -> 1 tab bid for 1 wk -> 2 tabs in AM and 1 in PM for 1 wk -> 2 tabs bid for 1 wk -> 3 tabs bid for 1 wk ->4 tabs bid    . vitamin E 400 UNIT capsule Take 400 Units by mouth daily.     No facility-administered medications prior to visit.        ROS:  Review of  Systems  Constitutional: Positive for fatigue. Negative for fever and unexpected weight change.  Respiratory: Negative for cough, shortness of breath and wheezing.   Cardiovascular: Negative for chest pain, palpitations and leg swelling.  Gastrointestinal: Negative for blood in stool, constipation, diarrhea, nausea and vomiting.  Endocrine: Negative for cold intolerance, heat intolerance and polyuria.  Genitourinary: Negative for dyspareunia, dysuria, flank pain, frequency, genital sores, hematuria, menstrual problem, pelvic pain, urgency, vaginal bleeding, vaginal discharge and vaginal pain.  Musculoskeletal: Negative for back pain, joint swelling and myalgias.  Skin: Negative for rash.  Neurological: Negative for dizziness, syncope, light-headedness, numbness and headaches.  Hematological: Negative for adenopathy.  Psychiatric/Behavioral: Positive for dysphoric mood. Negative for agitation, confusion, sleep disturbance and suicidal ideas. The patient is not nervous/anxious.   BREAST: No symptoms   Objective: BP 120/70   Pulse 67   Ht 5' (1.524 m)   Wt 110 lb (49.9 kg)   LMP  (LMP Unknown)   BMI 21.48 kg/m    Physical Exam  Constitutional: She is oriented to person, place, and time. She appears well-developed and well-nourished.  Genitourinary: Rectum normal, vagina normal and uterus normal. There is no rash or tenderness on the right labia. There is no rash or tenderness on the left labia. No erythema or tenderness in the vagina. No vaginal discharge found. Right adnexum does not display mass and does not display tenderness. Left adnexum does not display mass and does not display tenderness. Cervix does not exhibit motion tenderness or polyp. Uterus is not enlarged or tender. Rectal exam shows no mass, no tenderness and guaiac negative stool.  Neck: Normal range of motion. No thyromegaly present.  Cardiovascular: Normal rate, regular rhythm and normal heart sounds.  No murmur  heard. Pulmonary/Chest: Effort normal and breath sounds normal. Right breast exhibits no mass, no nipple discharge, no skin change and no tenderness. Left breast exhibits no mass, no nipple discharge, no skin change and no tenderness.  Abdominal: Soft. There is no tenderness. There is no guarding.  Musculoskeletal: Normal range of motion.  Neurological: She is alert and oriented to person, place, and time. No cranial nerve deficit.  Psychiatric: She has a normal mood and affect. Her behavior is normal.  Vitals reviewed.   Results: Results for orders placed or performed in visit on 02/07/18 (from the past 24  hour(s))  POCT Occult Blood Stool     Status: Normal   Collection Time: 02/07/18 10:37 AM  Result Value Ref Range   Fecal Occult Blood, POC Negative Negative   Card #1 Date     Card #2 Fecal Occult Blod, POC     Card #2 Date     Card #3 Fecal Occult Blood, POC     Card #3 Date      Assessment/Plan:  Encounter for annual routine gynecological examination  Cervical cancer screening - Plan: Cytology - PAP  Screening for HPV (human papillomavirus) - Plan: Cytology - PAP  Screening for breast cancer - Pt to sched mammo - Plan: MM DIGITAL SCREENING BILATERAL  Screening for colon cancer - Neg FOBT. Scr colonosocpy and cologuard discussed. Pt declines for now. Will f/u if desires.  - Plan: POCT Occult Blood Stool  Blood tests for routine general physical examination - Plan: Comprehensive metabolic panel, CBC with Differential/Platelet, Lipid panel  Screening cholesterol level - Plan: Lipid panel  Abnormal hemoglobin (HCC) - Plan: CBC with Differential/Platelet         GYN counsel breast self exam, mammography screening, menopause, adequate intake of calcium and vitamin D, diet and exercise    F/U  Return in about 1 year (around 02/08/2019).  Deeric Cruise B. Noelle Hoogland, PA-C 02/07/2018 10:42 AM

## 2018-02-08 LAB — CYTOLOGY - PAP
Diagnosis: NEGATIVE
HPV: NOT DETECTED

## 2018-02-23 ENCOUNTER — Other Ambulatory Visit: Payer: BLUE CROSS/BLUE SHIELD

## 2018-02-23 ENCOUNTER — Ambulatory Visit
Admission: RE | Admit: 2018-02-23 | Discharge: 2018-02-23 | Disposition: A | Discharge status: Home / Self Care | Payer: BLUE CROSS/BLUE SHIELD | Source: Ambulatory Visit | Attending: Obstetrics and Gynecology | Admitting: Obstetrics and Gynecology

## 2018-02-23 DIAGNOSIS — Z1239 Encounter for other screening for malignant neoplasm of breast: Secondary | ICD-10-CM

## 2018-02-23 DIAGNOSIS — Z0000 Encounter for general adult medical examination without abnormal findings: Secondary | ICD-10-CM

## 2018-02-23 DIAGNOSIS — Z13220 Encounter for screening for lipoid disorders: Secondary | ICD-10-CM

## 2018-02-23 DIAGNOSIS — D582 Other hemoglobinopathies: Secondary | ICD-10-CM

## 2018-02-23 DIAGNOSIS — Z1231 Encounter for screening mammogram for malignant neoplasm of breast: Secondary | ICD-10-CM | POA: Diagnosis present

## 2018-02-24 LAB — COMPREHENSIVE METABOLIC PANEL
ALK PHOS: 121 IU/L — AB (ref 39–117)
ALT: 9 IU/L (ref 0–32)
AST: 16 IU/L (ref 0–40)
Albumin/Globulin Ratio: 1.9 (ref 1.2–2.2)
Albumin: 4.5 g/dL (ref 3.5–5.5)
BUN/Creatinine Ratio: 15 (ref 9–23)
BUN: 14 mg/dL (ref 6–24)
Bilirubin Total: 0.3 mg/dL (ref 0.0–1.2)
CO2: 26 mmol/L (ref 20–29)
Calcium: 11 mg/dL — ABNORMAL HIGH (ref 8.7–10.2)
Chloride: 102 mmol/L (ref 96–106)
Creatinine, Ser: 0.95 mg/dL (ref 0.57–1.00)
GFR calc Af Amer: 78 mL/min/{1.73_m2} (ref >59)
GFR calc non Af Amer: 68 mL/min/{1.73_m2} (ref >59)
GLOBULIN, TOTAL: 2.4 g/dL (ref 1.5–4.5)
Glucose: 73 mg/dL (ref 65–99)
POTASSIUM: 4.9 mmol/L (ref 3.5–5.2)
SODIUM: 140 mmol/L (ref 134–144)
Total Protein: 6.9 g/dL (ref 6.0–8.5)

## 2018-02-24 LAB — LIPID PANEL
Chol/HDL Ratio: 3 ratio (ref 0.0–4.4)
Cholesterol, Total: 253 mg/dL — ABNORMAL HIGH (ref 100–199)
HDL: 83 mg/dL (ref >39)
LDL Calculated: 140 mg/dL — ABNORMAL HIGH (ref 0–99)
TRIGLYCERIDES: 151 mg/dL — AB (ref 0–149)
VLDL Cholesterol Cal: 30 mg/dL (ref 5–40)

## 2018-02-24 LAB — CBC WITH DIFFERENTIAL/PLATELET
Basophils Absolute: 0 10*3/uL (ref 0.0–0.2)
Basos: 1 %
EOS (ABSOLUTE): 0.2 10*3/uL (ref 0.0–0.4)
Eos: 3 %
Hematocrit: 47.2 % — ABNORMAL HIGH (ref 34.0–46.6)
Hemoglobin: 15.4 g/dL (ref 11.1–15.9)
IMMATURE GRANULOCYTES: 0 %
Immature Grans (Abs): 0 10*3/uL (ref 0.0–0.1)
Lymphocytes Absolute: 1.8 10*3/uL (ref 0.7–3.1)
Lymphs: 30 %
MCH: 31.1 pg (ref 26.6–33.0)
MCHC: 32.6 g/dL (ref 31.5–35.7)
MCV: 95 fL (ref 79–97)
MONOS ABS: 0.7 10*3/uL (ref 0.1–0.9)
Monocytes: 11 %
Neutrophils Absolute: 3.4 10*3/uL (ref 1.4–7.0)
Neutrophils: 55 %
PLATELETS: 209 10*3/uL (ref 150–450)
RBC: 4.95 x10E6/uL (ref 3.77–5.28)
RDW: 12.6 % (ref 12.3–15.4)
WBC: 6.2 10*3/uL (ref 3.4–10.8)

## 2018-02-27 ENCOUNTER — Telehealth: Payer: Self-pay | Admitting: Obstetrics and Gynecology

## 2018-02-27 ENCOUNTER — Encounter: Payer: Self-pay | Admitting: Obstetrics and Gynecology

## 2018-02-27 NOTE — Telephone Encounter (Signed)
Pt aware. Also sent via MyChart

## 2018-02-27 NOTE — Telephone Encounter (Signed)
Patient is calling for labs results. Please advise. 

## 2018-03-05 ENCOUNTER — Encounter: Payer: Self-pay | Admitting: Obstetrics and Gynecology

## 2019-09-23 ENCOUNTER — Other Ambulatory Visit: Payer: Self-pay

## 2019-09-23 ENCOUNTER — Ambulatory Visit: Payer: BLUE CROSS/BLUE SHIELD | Attending: Internal Medicine

## 2019-09-23 DIAGNOSIS — Z23 Encounter for immunization: Secondary | ICD-10-CM

## 2019-09-23 NOTE — Progress Notes (Signed)
   Covid-19 Vaccination Clinic  Name:  Sheryl Proctor    MRN: [id number] DOB: 10-Oct-1960  09/23/2019  [name] was observed post Covid-19 immunization for 15 minutes without incident. She was provided with Vaccine Information Sheet and instruction to access the V-Safe system.   [name] was instructed to call 911 with any severe reactions post vaccine: Marland Kitchen Difficulty breathing  . Swelling of face and throat  . A fast heartbeat  . A bad rash all over body  . Dizziness and weakness   Immunizations Administered    Name Date Dose VIS Date Route   Pfizer COVID-19 Vaccine 09/23/2019  8:30 AM 0.3 mL 06/07/2019 Intramuscular   Manufacturer: [place]   Lot: [id number]   [place]: [id number]

## 2019-10-16 ENCOUNTER — Ambulatory Visit: Payer: BLUE CROSS/BLUE SHIELD

## 2019-10-22 ENCOUNTER — Ambulatory Visit: Payer: BLUE CROSS/BLUE SHIELD | Attending: Internal Medicine

## 2019-10-22 DIAGNOSIS — Z23 Encounter for immunization: Secondary | ICD-10-CM

## 2019-10-22 NOTE — Progress Notes (Signed)
   Covid-19 Vaccination Clinic  Name:  Sheryl Proctor    MRN: [id number] DOB: 10/01/1960  10/22/2019  [name] was observed post Covid-19 immunization for 15 minutes without incident. She was provided with Vaccine Information Sheet and instruction to access the V-Safe system.   [name] was instructed to call 911 with any severe reactions post vaccine: Marland Kitchen Difficulty breathing  . Swelling of face and throat  . A fast heartbeat  . A bad rash all over body  . Dizziness and weakness   Immunizations Administered    Name Date Dose VIS Date Route   Pfizer COVID-19 Vaccine 10/22/2019 11:15 AM 0.3 mL 08/21/2018 Intramuscular   Manufacturer: [place]   Lot: [id number]   [place]: [id number]

## 2019-11-07 ENCOUNTER — Ambulatory Visit: Payer: BLUE CROSS/BLUE SHIELD | Admitting: Obstetrics and Gynecology

## 2019-11-08 ENCOUNTER — Other Ambulatory Visit: Payer: Self-pay

## 2019-11-08 ENCOUNTER — Ambulatory Visit: Payer: BLUE CROSS/BLUE SHIELD | Admitting: Nurse Practitioner

## 2022-10-14 LAB — HM MAMMOGRAPHY
